# Patient Record
Sex: Female | Born: 1990 | Race: White | Hispanic: No | Marital: Single | Smoking: Never smoker
Health system: Southern US, Community
[De-identification: ages and names within clinical notes are randomized; demographics above are authoritative.]

---
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
## 2009-03-09 ENCOUNTER — Ambulatory Visit (HOSPITAL_COMMUNITY): Payer: Self-pay | Admitting: Psychiatry

## 2009-05-25 ENCOUNTER — Ambulatory Visit (HOSPITAL_COMMUNITY): Payer: Self-pay | Admitting: Psychiatry

## 2009-07-02 ENCOUNTER — Ambulatory Visit (HOSPITAL_COMMUNITY): Payer: Self-pay | Admitting: Psychiatry

## 2009-10-01 ENCOUNTER — Ambulatory Visit (HOSPITAL_COMMUNITY): Payer: Self-pay | Admitting: Psychiatry

## 2009-11-09 ENCOUNTER — Other Ambulatory Visit: Admission: RE | Admit: 2009-11-09 | Discharge: 2009-11-09 | Payer: Self-pay | Admitting: Family Medicine

## 2010-07-20 ENCOUNTER — Ambulatory Visit (HOSPITAL_COMMUNITY): Payer: Self-pay | Admitting: Psychiatry

## 2010-10-19 ENCOUNTER — Ambulatory Visit (HOSPITAL_COMMUNITY): Admit: 2010-10-19 | Payer: Self-pay | Admitting: Psychiatry

## 2010-11-18 ENCOUNTER — Encounter (HOSPITAL_COMMUNITY): Payer: Self-pay | Admitting: Physician Assistant

## 2010-11-29 ENCOUNTER — Encounter (HOSPITAL_COMMUNITY): Payer: Self-pay | Admitting: Physician Assistant

## 2010-11-29 DIAGNOSIS — F988 Other specified behavioral and emotional disorders with onset usually occurring in childhood and adolescence: Secondary | ICD-10-CM

## 2010-11-29 DIAGNOSIS — F329 Major depressive disorder, single episode, unspecified: Secondary | ICD-10-CM

## 2010-11-29 DIAGNOSIS — F3289 Other specified depressive episodes: Secondary | ICD-10-CM

## 2010-12-01 ENCOUNTER — Other Ambulatory Visit: Payer: Self-pay | Admitting: Physician Assistant

## 2010-12-01 ENCOUNTER — Other Ambulatory Visit (HOSPITAL_COMMUNITY)
Admission: RE | Admit: 2010-12-01 | Discharge: 2010-12-01 | Disposition: A | Discharge status: Home / Self Care | Payer: 59 | Source: Ambulatory Visit | Attending: Family Medicine | Admitting: Family Medicine

## 2010-12-01 DIAGNOSIS — Z124 Encounter for screening for malignant neoplasm of cervix: Secondary | ICD-10-CM | POA: Insufficient documentation

## 2011-02-28 ENCOUNTER — Encounter (HOSPITAL_COMMUNITY): Payer: Self-pay | Admitting: Physician Assistant

## 2011-03-21 ENCOUNTER — Encounter (HOSPITAL_COMMUNITY): Payer: Private Health Insurance - Indemnity | Admitting: Physician Assistant

## 2011-04-13 ENCOUNTER — Encounter (HOSPITAL_COMMUNITY): Payer: Private Health Insurance - Indemnity | Admitting: Physician Assistant

## 2011-04-13 DIAGNOSIS — F329 Major depressive disorder, single episode, unspecified: Secondary | ICD-10-CM

## 2011-04-13 DIAGNOSIS — F988 Other specified behavioral and emotional disorders with onset usually occurring in childhood and adolescence: Secondary | ICD-10-CM

## 2011-06-01 ENCOUNTER — Ambulatory Visit (HOSPITAL_BASED_OUTPATIENT_CLINIC_OR_DEPARTMENT_OTHER): Payer: Private Health Insurance - Indemnity | Admitting: Psychology

## 2011-06-01 DIAGNOSIS — F39 Unspecified mood [affective] disorder: Secondary | ICD-10-CM

## 2011-06-09 ENCOUNTER — Encounter (INDEPENDENT_AMBULATORY_CARE_PROVIDER_SITE_OTHER): Payer: Private Health Insurance - Indemnity | Admitting: Psychology

## 2011-06-09 DIAGNOSIS — F39 Unspecified mood [affective] disorder: Secondary | ICD-10-CM

## 2011-06-23 ENCOUNTER — Encounter (HOSPITAL_COMMUNITY): Payer: Private Health Insurance - Indemnity | Admitting: Psychology

## 2011-07-12 ENCOUNTER — Encounter (INDEPENDENT_AMBULATORY_CARE_PROVIDER_SITE_OTHER): Payer: Private Health Insurance - Indemnity | Admitting: Physician Assistant

## 2011-07-12 DIAGNOSIS — F411 Generalized anxiety disorder: Secondary | ICD-10-CM

## 2011-07-12 DIAGNOSIS — F329 Major depressive disorder, single episode, unspecified: Secondary | ICD-10-CM

## 2011-07-21 ENCOUNTER — Encounter (HOSPITAL_COMMUNITY): Payer: Private Health Insurance - Indemnity | Admitting: Psychology

## 2011-07-21 ENCOUNTER — Encounter (HOSPITAL_COMMUNITY): Payer: Self-pay

## 2011-08-28 ENCOUNTER — Other Ambulatory Visit (HOSPITAL_COMMUNITY): Payer: Self-pay | Admitting: Physician Assistant

## 2011-08-28 DIAGNOSIS — F988 Other specified behavioral and emotional disorders with onset usually occurring in childhood and adolescence: Secondary | ICD-10-CM

## 2011-08-28 MED ORDER — LISDEXAMFETAMINE DIMESYLATE 30 MG PO CAPS: 30.0000 mg | ORAL_CAPSULE | ORAL | Status: DC

## 2011-09-08 ENCOUNTER — Other Ambulatory Visit (HOSPITAL_COMMUNITY): Payer: Self-pay | Admitting: Physician Assistant

## 2011-09-08 DIAGNOSIS — F411 Generalized anxiety disorder: Secondary | ICD-10-CM

## 2011-09-08 DIAGNOSIS — F329 Major depressive disorder, single episode, unspecified: Secondary | ICD-10-CM

## 2011-09-11 ENCOUNTER — Other Ambulatory Visit: Payer: Self-pay | Admitting: Physician Assistant

## 2011-09-11 MED ORDER — SERTRALINE HCL 50 MG PO TABS: 50.0000 mg | ORAL_TABLET | Freq: Every day | ORAL | Status: DC

## 2011-09-13 ENCOUNTER — Ambulatory Visit (INDEPENDENT_AMBULATORY_CARE_PROVIDER_SITE_OTHER): Payer: Private Health Insurance - Indemnity | Admitting: Physician Assistant

## 2011-09-13 DIAGNOSIS — F988 Other specified behavioral and emotional disorders with onset usually occurring in childhood and adolescence: Secondary | ICD-10-CM

## 2011-09-13 MED ORDER — LISDEXAMFETAMINE DIMESYLATE 30 MG PO CAPS: 30.0000 mg | ORAL_CAPSULE | ORAL | Status: DC

## 2011-09-13 NOTE — Progress Notes (Signed)
   [facility] Follow-up Outpatient Visit  Donna Frost Sep 12, 1991  Date: 09/13/11    Subjective: She reports that she is doing well overall. She states that school is very stressful right now and she is looking forward to finishing on December 7. She has plans to attend a study abroad program in [place], [place] beginning early February 2013. She has much preparation to do before that time. We will need to work out how she will receive her medications while out of the country. We discussed having her mother mail them to her. She reports that her depression and anxiety are well controlled. She endorses good sleep and appetite.  There were no vitals filed for this visit.  Mental Status Examination  Appearance: Well groomed and casually dressed Alert: Yes Attention: good  Cooperative: Yes Eye Contact: Good Speech: Clear and even Psychomotor Activity: Normal Memory/Concentration: Intact Oriented: person, place, time/date and situation Mood: Euthymic Affect: Congruent Thought Processes and Associations: Goal Directed Fund of Knowledge: Good Thought Content:  Insight: Good Judgement: Good  Diagnosis: ADHD inattentive type, generalized anxiety disorder, depressive disorder NOS.  Treatment Plan: We will continue her medications as currently prescribed. She has an appointment to return in about one month, and at that time we will discuss prescribing her medications while abroad.  Helem Reesor, PA

## 2011-10-03 ENCOUNTER — Ambulatory Visit (HOSPITAL_COMMUNITY): Payer: Private Health Insurance - Indemnity | Admitting: Physician Assistant

## 2011-11-01 ENCOUNTER — Other Ambulatory Visit (HOSPITAL_COMMUNITY): Payer: Self-pay | Admitting: Physician Assistant

## 2011-11-06 ENCOUNTER — Other Ambulatory Visit (HOSPITAL_COMMUNITY): Payer: Self-pay

## 2011-11-07 ENCOUNTER — Encounter (HOSPITAL_COMMUNITY): Payer: Self-pay | Admitting: *Deleted

## 2011-11-07 ENCOUNTER — Other Ambulatory Visit (HOSPITAL_COMMUNITY): Payer: Self-pay | Admitting: *Deleted

## 2011-11-07 DIAGNOSIS — F329 Major depressive disorder, single episode, unspecified: Secondary | ICD-10-CM

## 2011-11-07 DIAGNOSIS — F411 Generalized anxiety disorder: Secondary | ICD-10-CM

## 2011-11-07 DIAGNOSIS — F988 Other specified behavioral and emotional disorders with onset usually occurring in childhood and adolescence: Secondary | ICD-10-CM

## 2011-11-07 MED ORDER — LISDEXAMFETAMINE DIMESYLATE 30 MG PO CAPS: 30.0000 mg | ORAL_CAPSULE | ORAL | Status: DC

## 2011-11-07 MED ORDER — SERTRALINE HCL 50 MG PO TABS: 50.0000 mg | ORAL_TABLET | Freq: Every day | ORAL | Status: DC

## 2011-11-08 ENCOUNTER — Other Ambulatory Visit (HOSPITAL_COMMUNITY): Payer: Self-pay

## 2011-11-09 ENCOUNTER — Other Ambulatory Visit (HOSPITAL_COMMUNITY): Payer: Self-pay | Admitting: *Deleted

## 2011-11-09 ENCOUNTER — Encounter (HOSPITAL_COMMUNITY): Payer: Self-pay | Admitting: *Deleted

## 2011-11-09 DIAGNOSIS — F988 Other specified behavioral and emotional disorders with onset usually occurring in childhood and adolescence: Secondary | ICD-10-CM

## 2011-11-09 DIAGNOSIS — F902 Attention-deficit hyperactivity disorder, combined type: Secondary | ICD-10-CM

## 2011-11-09 MED ORDER — SERTRALINE HCL 50 MG PO TABS: 50.0000 mg | ORAL_TABLET | Freq: Every day | ORAL | Status: DC

## 2011-11-09 MED ORDER — AMPHETAMINE-DEXTROAMPHETAMINE 10 MG PO TABS: ORAL_TABLET | ORAL | Status: DC

## 2012-06-06 ENCOUNTER — Other Ambulatory Visit (HOSPITAL_COMMUNITY): Payer: Self-pay | Admitting: *Deleted

## 2012-06-06 DIAGNOSIS — F988 Other specified behavioral and emotional disorders with onset usually occurring in childhood and adolescence: Secondary | ICD-10-CM

## 2012-06-06 NOTE — Telephone Encounter (Addendum)
Has been out of country studying. Now is back and needs refills of stimulants Did not request Zoloft

## 2012-06-20 ENCOUNTER — Other Ambulatory Visit (HOSPITAL_COMMUNITY): Payer: Self-pay | Admitting: *Deleted

## 2012-06-20 DIAGNOSIS — F988 Other specified behavioral and emotional disorders with onset usually occurring in childhood and adolescence: Secondary | ICD-10-CM

## 2012-06-21 MED ORDER — AMPHETAMINE-DEXTROAMPHETAMINE 10 MG PO TABS: ORAL_TABLET | ORAL | Status: DC

## 2012-06-21 MED ORDER — LISDEXAMFETAMINE DIMESYLATE 30 MG PO CAPS: 30.0000 mg | ORAL_CAPSULE | ORAL | Status: AC

## 2012-08-07 ENCOUNTER — Ambulatory Visit (INDEPENDENT_AMBULATORY_CARE_PROVIDER_SITE_OTHER): Payer: Private Health Insurance - Indemnity | Admitting: Physician Assistant

## 2012-08-07 DIAGNOSIS — F988 Other specified behavioral and emotional disorders with onset usually occurring in childhood and adolescence: Secondary | ICD-10-CM | POA: Insufficient documentation

## 2012-08-07 MED ORDER — LISDEXAMFETAMINE DIMESYLATE 30 MG PO CAPS: 30.0000 mg | ORAL_CAPSULE | ORAL | Status: DC

## 2012-08-07 MED ORDER — AMPHETAMINE-DEXTROAMPHETAMINE 10 MG PO TABS: ORAL_TABLET | ORAL | Status: DC

## 2012-08-07 NOTE — Progress Notes (Signed)
   [facility] Follow-up Outpatient Visit  Marijane Trower May 30, 1991  Date: 08/07/2012   Subjective: Ellaina presents today to followup on her treatment for ADHD. She was in [place] from January through July of this year. She plans to graduate in December of this year, and reports that she is very busy trying to wrap things up at school. She continues to take the Vyvanse 30 mg some days, and Adderall 10 or 20 mg on other days. She feels that they are helpful and wants to continue them at least through school. 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 While in [place] she stopped taking the Prozac, and feels that she no longer needs it. She does report that she is having some anxiety regarding school, and has found it difficult to make class some days. She denies any suicidal or homicidal ideation. She denies any auditory or visual hallucinations.  There were no vitals filed for this visit.  Mental Status Examination  Appearance: Well groomed and casually dressed Alert: Yes Attention: good  Cooperative: Yes Eye Contact: Good Speech: Clear and coherent Psychomotor Activity: Normal Memory/Concentration: Intact Oriented: person, place, time/date and situation Mood: Euthymic Affect: Appropriate Thought Processes and Associations: Linear Fund of Knowledge: Fair Thought Content: Normal Insight: Fair Judgement: Good  Diagnosis: ADHD, inattentive type  Treatment Plan: We will continue her Vyvanse 30 mg daily or Adderall 10-20 mg daily as is appropriate for that day. We will discontinue her Zoloft for now, but if her anxiety continues we can resume it. She will return for followup in 3 months.  Auron Tadros, PA-C

## 2012-11-07 ENCOUNTER — Ambulatory Visit (INDEPENDENT_AMBULATORY_CARE_PROVIDER_SITE_OTHER): Payer: Private Health Insurance - Indemnity | Admitting: Physician Assistant

## 2012-11-07 DIAGNOSIS — F988 Other specified behavioral and emotional disorders with onset usually occurring in childhood and adolescence: Secondary | ICD-10-CM

## 2012-11-07 MED ORDER — AMPHETAMINE-DEXTROAMPHETAMINE 10 MG PO TABS: ORAL_TABLET | ORAL | Status: DC

## 2012-11-07 MED ORDER — LISDEXAMFETAMINE DIMESYLATE 30 MG PO CAPS: 30.0000 mg | ORAL_CAPSULE | ORAL | Status: DC

## 2012-11-07 NOTE — Progress Notes (Signed)
   [facility] Follow-up Outpatient Visit  Donna Frost [birth year]  Date: 11/07/2012   Subjective: Donna Frost presents today to followup on her treatment for ADHD. She happily reports that she graduated and now has a degree in communications. She is currently looking for employment. 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 She recently went to [facility], and got caught in the [place] up there. She did abstain for 2 weeks because she was unable to get a flight home. She denies that she is experiencing any anxiety. She feels that the medications are helpful and sees no reason to change the dosing.  There were no vitals filed for this visit.  Mental Status Examination  Appearance: Casual Alert: Yes Attention: good  Cooperative: Yes Eye Contact: Good Speech: Clear and coherent Psychomotor Activity: Normal Memory/Concentration: Intact Oriented: person, place, time/date and situation Mood: Euthymic Affect: Appropriate Thought Processes and Associations: Linear Fund of Knowledge: Good Thought Content: Normal Insight: Good Judgement: Good  Diagnosis: ADHD, inattentive type  Treatment Plan: We will continue her Vyvanse 30 mg daily for her Adderall 10 mg 1-2 times daily as is appropriate for that particular day. She will return for followup in 3 months.  Jamesyn Moorefield, PA-C

## 2012-11-22 ENCOUNTER — Telehealth (HOSPITAL_COMMUNITY): Payer: Self-pay

## 2012-12-03 ENCOUNTER — Encounter (HOSPITAL_COMMUNITY): Payer: Self-pay | Admitting: *Deleted

## 2012-12-03 NOTE — Progress Notes (Signed)
Received authorization from [facility] for Vyvanse 30 mg daily. Effective 12/03/12 thru 12/03/13 Pharmacy and patient notified

## 2013-02-05 ENCOUNTER — Ambulatory Visit (INDEPENDENT_AMBULATORY_CARE_PROVIDER_SITE_OTHER): Payer: Private Health Insurance - Indemnity | Admitting: Physician Assistant

## 2013-02-05 DIAGNOSIS — F988 Other specified behavioral and emotional disorders with onset usually occurring in childhood and adolescence: Secondary | ICD-10-CM

## 2013-02-05 MED ORDER — AMPHETAMINE-DEXTROAMPHETAMINE 10 MG PO TABS: ORAL_TABLET | ORAL | Status: DC

## 2013-02-05 MED ORDER — LISDEXAMFETAMINE DIMESYLATE 30 MG PO CAPS: 30.0000 mg | ORAL_CAPSULE | ORAL | Status: DC

## 2013-02-05 NOTE — Progress Notes (Signed)
   [facility] Follow-up Outpatient Visit  Donna Frost [birth year]  Date: 02/05/2013   Subjective: [name] presents today to followup on her treatment for ADHD. 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 She states that sometimes she feels that she is wasting her time by doing that, but it is necessary at this point. 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 She continues to use her Vyvanse an Adderall interchangeably depending on what is appropriate for that particular day.  There were no vitals filed for this visit.  Mental Status Examination  Appearance: Well groomed and nicely dressed Alert: Yes Attention: good  Cooperative: Yes Eye Contact: Good Speech: Clear and coherent Psychomotor Activity: Normal Memory/Concentration: Intact Oriented: person, place, time/date and situation Mood: Euthymic Affect: Appropriate Thought Processes and Associations: Linear Fund of Knowledge: Good Thought Content: Normal Insight: Fair Judgement: Good  Diagnosis: ADHD, inattentive type  Treatment Plan: We will continue her Vyvanse 30 mg daily or Adderall 10 mg twice daily as is appropriate. She will return for followup in 3 months  Imanni Burdine, PA-C

## 2013-03-19 ENCOUNTER — Emergency Department (HOSPITAL_COMMUNITY)
Admission: EM | Admit: 2013-03-19 | Discharge: 2013-03-19 | Disposition: A | Discharge status: Home / Self Care | Payer: Managed Care, Other (non HMO) | Attending: Emergency Medicine | Admitting: Emergency Medicine

## 2013-03-19 ENCOUNTER — Emergency Department (HOSPITAL_COMMUNITY): Payer: Managed Care, Other (non HMO)

## 2013-03-19 DIAGNOSIS — W1809XA Striking against other object with subsequent fall, initial encounter: Secondary | ICD-10-CM | POA: Insufficient documentation

## 2013-03-19 DIAGNOSIS — Y9389 Activity, other specified: Secondary | ICD-10-CM | POA: Insufficient documentation

## 2013-03-19 DIAGNOSIS — S1093XA Contusion of unspecified part of neck, initial encounter: Secondary | ICD-10-CM | POA: Insufficient documentation

## 2013-03-19 DIAGNOSIS — Z3202 Encounter for pregnancy test, result negative: Secondary | ICD-10-CM | POA: Insufficient documentation

## 2013-03-19 DIAGNOSIS — F1010 Alcohol abuse, uncomplicated: Secondary | ICD-10-CM | POA: Insufficient documentation

## 2013-03-19 DIAGNOSIS — R1110 Vomiting, unspecified: Secondary | ICD-10-CM | POA: Insufficient documentation

## 2013-03-19 DIAGNOSIS — Y9289 Other specified places as the place of occurrence of the external cause: Secondary | ICD-10-CM | POA: Insufficient documentation

## 2013-03-19 DIAGNOSIS — S0003XA Contusion of scalp, initial encounter: Secondary | ICD-10-CM | POA: Insufficient documentation

## 2013-03-19 DIAGNOSIS — S0990XA Unspecified injury of head, initial encounter: Secondary | ICD-10-CM | POA: Insufficient documentation

## 2013-03-19 DIAGNOSIS — IMO0002 Reserved for concepts with insufficient information to code with codable children: Secondary | ICD-10-CM

## 2013-03-19 LAB — POCT I-STAT, CHEM 8
BUN: 8 mg/dL (ref 6–23)
Calcium, Ion: 1.15 mmol/L (ref 1.12–1.23)
Chloride: 106 mEq/L (ref 96–112)
Creatinine, Ser: 1.1 mg/dL (ref 0.50–1.10)
Glucose, Bld: 124 mg/dL — ABNORMAL HIGH (ref 70–99)
HCT: 40 % (ref 36.0–46.0)
Hemoglobin: 13.6 g/dL (ref 12.0–15.0)
Potassium: 3.1 mEq/L — ABNORMAL LOW (ref 3.5–5.1)
Sodium: 140 mEq/L (ref 135–145)
TCO2: 24 mmol/L (ref 0–100)

## 2013-03-19 LAB — ETHANOL: Alcohol, Ethyl (B): 295 mg/dL — ABNORMAL HIGH (ref 0–11)

## 2013-03-19 LAB — POCT PREGNANCY, URINE: Preg Test, Ur: NEGATIVE

## 2013-03-19 MED ORDER — SODIUM CHLORIDE 0.9 % IV BOLUS (SEPSIS)
1000.0000 mL | Freq: Once | INTRAVENOUS | Status: AC
  Administered 2013-03-19: 1000 mL via INTRAVENOUS

## 2013-03-19 NOTE — ED Notes (Addendum)
Pt brought to ED with slow response. Pt with EtOH on board. Pt reports drinking wine. Not able to report amount consumed. VS stable. Pt able to verbalize name. Pt has dried blood in hair. Small lacer to R side of head. Minimal bleeding.

## 2013-03-19 NOTE — ED Notes (Signed)
Pt sts that she was not able to urinate at this time.  

## 2013-03-24 NOTE — ED Provider Notes (Signed)
History    22 year old female presenting after falling and striking her head. Patient is intoxicated. She is unclear as to the exact circumstances of her fall. She denies any other ingestions aside from alcohol. Patient is currently denying any pain aside from headache, although her history isn't reliable given her level of intoxication. Per review of records, patient appears to be otherwise healthy aside from history of ADD. She is on stimulants for this. No other medications identified, specifically blood thinners.    CSN: [id number]  Arrival date & time 03/19/13  0351   First MD Initiated Contact with Patient 03/19/13 0406      Chief Complaint  Patient presents with  . Alcohol Intoxication  . Head Injury    (Consider location/radiation/quality/duration/timing/severity/associated sxs/prior treatment) HPI  No past medical history on file.  No past surgical history on file.  No family history on file.  History  Substance Use Topics  . Smoking status: Not on file  . Smokeless tobacco: Not on file  . Alcohol Use: Not on file    OB History   No data available      Review of Systems  Level V caveat applies because patient is intoxicated.  Allergies  Review of patient's allergies indicates no known allergies.  Home Medications   Current Outpatient Rx  Name  Route  Sig  Dispense  Refill  . amphetamine-dextroamphetamine (ADDERALL) 10 MG tablet   Oral   Take 10-20 mg by mouth daily as needed (concentration).         Marland Kitchen lisdexamfetamine (VYVANSE) 30 MG capsule   Oral   Take 1 capsule (30 mg total) by mouth every morning.   30 capsule   0     Fill after 03/03/13     BP 103/68  Pulse 100  Temp(Src) 98.9 F (37.2 C) (Oral)  Resp 18  SpO2 100%  LMP 03/05/2013  Physical Exam  Nursing note and vitals reviewed. Constitutional: She appears well-developed and well-nourished.  Vomit on gown  HENT:  Head: Normocephalic.  Hematoma to the high right parietal  region with an overlying abrasion. No active bleeding. Has gag. No intraoral injury/vomit/FB noted.   Eyes: Pupils are equal, round, and reactive to light. Right eye exhibits no discharge. Left eye exhibits no discharge.  conjuntiva injected b/l  Neck: Neck supple.  Cardiovascular: Normal rate, regular rhythm and normal heart sounds.  Exam reveals no gallop and no friction rub.   No murmur heard. Pulmonary/Chest: Effort normal and breath sounds normal. No respiratory distress.  Abdominal: Soft. She exhibits no distension. There is no tenderness.  Musculoskeletal: She exhibits no edema and no tenderness.  No midline spinal tenderness. No apparent bony tenderness of the extremities or apparent pain with range of motion large joints.  Neurological: She is alert. She exhibits normal muscle tone.  Patient's speech is slurred and slow but understandable. Very drowsy. Will awaken to light tactile stimuli but will quickly closer eyes she is not continuously stimulated. She is moving all extremities on command. No obvious facial droop. More complex commands such as finger to nose testing patient is unable to participate with.  Skin: Skin is warm and dry.    ED Course  Procedures (including critical care time)  Labs Reviewed  ETHANOL - Abnormal; Notable for the following:    Alcohol, Ethyl (B) 295 (*)    All other components within normal limits  POCT I-STAT, CHEM 8 - Abnormal; Notable for the following:    Potassium 3.1 (*)  Glucose, Bld 124 (*)    All other components within normal limits  POCT PREGNANCY, URINE   No results found.  Ct Head Wo Contrast  03/19/2013   *RADIOLOGY REPORT*  Clinical Data: History of trauma from a fall.  Hematoma in the high right parietal region.  CT HEAD WITHOUT CONTRAST  Technique:  Contiguous axial images were obtained from the base of the skull through the vertex without contrast.  Comparison: No priors.  Findings: A small amount of soft tissue swelling in the  scalp of the high right parietal region, with several tiny locules of gas in this region, indicative of a hematoma/laceration.  No underlying displaced skull fracture is identified.  No acute intracranial abnormality.  Specifically, no definite evidence of acute post- traumatic intracranial hemorrhage, no signs of acute/subacute cerebral ischemia, no mass, mass effect, hydrocephalus or abnormal intra or extra-axial fluid collections.  Visualized paranasal sinuses and mastoids are well pneumatized.  IMPRESSION: 1.  Small laceration/hematoma in the high right parietal scalp without evidence of underlying skull fracture or acute intracranial abnormality.   Original Report Authenticated By: Trudie Reed, M.D.   EKG:  Rhythm: Sinus tachycardia and Vent. rate 102 BPM First degree AV block. PR interval 204 ms QRS duration 92 ms QT/QTc 360/469 ms Left atrial enlargement  ST segments: nonspecific ST changes Comparison: None   1. Intoxication   2. Head injury, initial encounter       MDM  22 year old female presenting with alcohol intoxication evidence of head injury. Presumably after a fall, although history is not completely clear. CT of the head does not show any acute intracranial abnormality or evidence of fracture. Her EKG is not completely normal his but does not show any abnormality which would necessarily explain a fall. Patient was observed for a period of several hours. She still appears to be mildly intoxicated although significantly improved since she first presented and is HD stable.  I have a very low suspicion for an emergent process requiring further observation, admission or workup. She will be discharged with a sober adult.        Raeford Razor, MD 03/24/13 1435

## 2013-05-07 ENCOUNTER — Ambulatory Visit (INDEPENDENT_AMBULATORY_CARE_PROVIDER_SITE_OTHER): Payer: Private Health Insurance - Indemnity | Admitting: Physician Assistant

## 2013-05-07 VITALS — BP 107/69 | HR 82 | Ht 66.0 in | Wt 146.0 lb

## 2013-05-07 DIAGNOSIS — F988 Other specified behavioral and emotional disorders with onset usually occurring in childhood and adolescence: Secondary | ICD-10-CM

## 2013-05-07 MED ORDER — AMPHETAMINE-DEXTROAMPHETAMINE 10 MG PO TABS: 10.0000 mg | ORAL_TABLET | Freq: Every day | ORAL | Status: DC | PRN

## 2013-05-07 MED ORDER — AMPHETAMINE-DEXTROAMPHET ER 15 MG PO CP24: 15.0000 mg | ORAL_CAPSULE | ORAL | Status: DC

## 2013-05-07 NOTE — Progress Notes (Signed)
[facility] [id number] Progress Note  Donna Frost [id number] 22 y.o.  05/07/2013 3:46 PM  Chief Complaint: followup visit for ADHD  History of Present Illness: Donna Frost presents today to followup on her treatment for ADHD. She reports that the last time she went to purchase the Vyvanse it was extremely expensive. She asked if there were something less expensive that she can try. On review of her chart, it was noted that she was in the emergency department in early June. No reported that she had fallen while intoxicated. We discussed her drinking behavior, as well as any family history of addiction. She reported that her father and her maternal grandfather both had problems with alcohol. She reports that she drinks on average once weekly, and usually only one drink. She reports that when she does drink, she tends to drink too fast. She reports that the occasion where she had to go to the emergency room was an anomaly.  Suicidal Ideation: No Plan Formed: No Patient has means to carry out plan: No  Homicidal Ideation: No Plan Formed: No Patient has means to carry out plan: No  Review of Systems: Psychiatric: Agitation: No Hallucination: No Depressed Mood: No Insomnia: No Hypersomnia: No Altered Concentration: No Feels Worthless: No Grandiose Ideas: No Belief In Special Powers: No New/Increased Substance Abuse: Patient denies Compulsions: No  Neurologic: Headache: No Seizure: No Paresthesias: No  Past Medical History: Noncontributory  Social History: Currently living in a house with 2 roommates. Planning to move back with her mother and father for financial reasons. Currently working 2 jobs in Personnel officer.  Family History: Family history of alcoholism  Outpatient Encounter Prescriptions as of 05/07/2013  Medication Sig Dispense Refill  . amphetamine-dextroamphetamine (ADDERALL XR) 15 MG 24 hr capsule Take 1 capsule (15 mg total) by mouth every morning.  30 capsule  0  .  amphetamine-dextroamphetamine (ADDERALL) 10 MG tablet Take 1-2 tablets (10-20 mg total) by mouth daily as needed (concentration).  60 tablet  0  . lisdexamfetamine (VYVANSE) 30 MG capsule Take 1 capsule (30 mg total) by mouth every morning.  30 capsule  0  . [DISCONTINUED] amphetamine-dextroamphetamine (ADDERALL) 10 MG tablet Take 10-20 mg by mouth daily as needed (concentration).       No facility-administered encounter medications on file as of 05/07/2013.    Past Psychiatric History/Hospitalization(s): Anxiety: No Bipolar Disorder: No Depression: No Mania: No Psychosis: No Schizophrenia: No Personality Disorder: No Hospitalization for psychiatric illness: No History of Electroconvulsive Shock Therapy: No Prior Suicide Attempts: No  Physical Exam: Constitutional:  BP 107/69  Pulse 82  Ht 5\' 6"  (1.676 m)  Wt 146 lb (66.225 kg)  BMI 23.58 kg/m2  General Appearance: alert, oriented, no acute distress, well nourished and casually dressed  Musculoskeletal: Strength & Muscle Tone: within normal limits Gait & Station: normal Patient leans: N/A  Psychiatric: Speech (describe rate, volume, coherence, spontaneity, and abnormalities if any): Clear and coherent had a regular rate and rhythm and normal volume  Thought Process (describe rate, content, abstract reasoning, and computation): within normal limits  Associations: Intact  Thoughts: normal  Mental Status: Orientation: oriented to person, place, time/date and situation Mood & Affect: euthymic mood with congruent affect Attention Span & Concentration: intact  Medical Decision Making (Choose Three): Established Problem, Stable/Improving (1), Review of Psycho-Social Stressors (1) and Review of New Medication or Change in Dosage (2)  Assessment: Axis I: ADHD, inattentive type  Axis II: deferred  Axis III: noncontributory  Axis IV: moderate  Axis V: 75   Plan: We will try her on Adderall XR 15 mg daily, and  continue the short acting Adderall 10-20 mg daily as needed. She has been given a discount card to see if it'll help on the price of Vyvanse. She is to report back in a few weeks as to her response to the extended release Adderall, and let us know if she wants to continue it or go back to using the Vyvanse. At that time she will require a 90 day prescription. We spoke at some length regarding the genetic predisposition to alcoholism, and she was warned to be very vigilant for problems in stopping drinking when she wants to. She will return for followup visit in 3 months.  Nadezhda Pollitt, PA-C 05/07/2013

## 2013-08-05 ENCOUNTER — Ambulatory Visit (HOSPITAL_COMMUNITY): Payer: Self-pay | Admitting: Physician Assistant

## 2013-08-11 ENCOUNTER — Ambulatory Visit (HOSPITAL_COMMUNITY): Payer: Self-pay | Admitting: Physician Assistant

## 2013-08-28 ENCOUNTER — Other Ambulatory Visit (HOSPITAL_COMMUNITY): Payer: Self-pay | Admitting: *Deleted

## 2013-08-28 DIAGNOSIS — F909 Attention-deficit hyperactivity disorder, unspecified type: Secondary | ICD-10-CM

## 2013-08-29 ENCOUNTER — Other Ambulatory Visit (HOSPITAL_COMMUNITY): Payer: Self-pay | Admitting: Psychiatry

## 2013-08-29 NOTE — Telephone Encounter (Signed)
Need to be seen

## 2013-09-02 MED ORDER — AMPHETAMINE-DEXTROAMPHETAMINE 10 MG PO TABS: 10.0000 mg | ORAL_TABLET | Freq: Every day | ORAL | Status: DC | PRN

## 2013-09-02 NOTE — Addendum Note (Signed)
Addended by: Tonny Bollman on: 09/02/2013 03:56 PM   Modules accepted: Orders

## 2013-09-02 NOTE — Telephone Encounter (Signed)
Refill authorized by Dr.Kumar as previous provider no longer with practice.

## 2013-09-15 ENCOUNTER — Encounter (HOSPITAL_COMMUNITY): Payer: Self-pay | Admitting: Psychiatry

## 2013-09-15 ENCOUNTER — Ambulatory Visit (INDEPENDENT_AMBULATORY_CARE_PROVIDER_SITE_OTHER): Payer: Private Health Insurance - Indemnity | Admitting: Psychiatry

## 2013-09-15 VITALS — BP 109/66 | HR 76 | Ht 66.0 in | Wt 147.0 lb

## 2013-09-15 DIAGNOSIS — F329 Major depressive disorder, single episode, unspecified: Secondary | ICD-10-CM

## 2013-09-15 DIAGNOSIS — F988 Other specified behavioral and emotional disorders with onset usually occurring in childhood and adolescence: Secondary | ICD-10-CM

## 2013-09-15 NOTE — Progress Notes (Signed)
[facility] [id number] Progress Note  Donna Frost [id number] 22 y.o.  09/15/2013 3:46 PM  Chief Complaint:  I have depression and  ADHD  History of Present Illness:  Donna Frost is 22 year old Caucasian single unemployed female who came for her appointment.  She is diagnosed with depression and ADHD.  She is taking Adderall which is given by physician assistant the patient has been seen in this office since 2007.  She was taking Prozac however she stopped one year ago because she felt it was not working.  Recently stimulant was changed because she could not afford Vyvanse .  She is taking Adderall however she continued to drink mostly on the weekends.  She admitted some depressive symptoms .  She has some lack of sleep but denies any agitation anger or any mood swings.  She denies any intravenous drug use or any other illegal substances.  She had paranoia or any hallucination.  She is living with her parents.  She is graduated since December 2013 and looking for a job.  She admitted not taking the Adderall on those days when she drank .  In June 2014 she had visited the emergency room because of intoxication and her blood alcohol level was 195.  We discussed her drinking behavior, as well as any family history of addiction. She reported that her father and her maternal grandfather both had problems with alcohol.  We talked about trying Wellbutrin this she had never tried before.  Patient is willing to give [place] a call back if she decided on Wellbutrin after doing some research.  We will not provide any stimulant on this visit.  Suicidal Ideation: No Plan Formed: No Patient has means to carry out plan: No  Homicidal Ideation: No Plan Formed: No Patient has means to carry out plan: No  Review of Systems: Psychiatric: Agitation: No Hallucination: No Depressed Mood: Yes Insomnia: No Hypersomnia: Yes Altered Concentration: No Feels Worthless: No Grandiose Ideas: No Belief In Special Powers:  No New/Increased Substance Abuse: Patient denies Compulsions: No  Neurologic: Headache: No Seizure: No Paresthesias: No  Past Medical History:  Noncontributory.  Her primary care physician is [facility].  Social History:  Patient recently moved to live with her parents.  She is graduated last year and looking for a job.   She stopped working 2 months ago because she could not handle it very well.  Family History:  Family history of alcoholism  Outpatient Encounter Prescriptions as of 09/15/2013  Medication Sig  . amphetamine-dextroamphetamine (ADDERALL) 10 MG tablet Take 1-2 tablets (10-20 mg total) by mouth daily as needed (concentration).    Past Psychiatric History/Hospitalization(s): Patient is seen in this office since 2007.  She was diagnosed with depression and ADHD.  She is to see therapist in his office however she stopped seeing therapist because she felt it was not helpful.  In the past she had tried Prozac, Zoloft , vyvanse and Adderall. Anxiety: No Bipolar Disorder: No Depression: No Mania: No Psychosis: No Schizophrenia: No Personality Disorder: No Hospitalization for psychiatric illness: No History of Electroconvulsive Shock Therapy: No Prior Suicide Attempts: No  Physical Exam: Constitutional:  BP 109/66  Pulse 76  Ht 5\' 6"  (1.676 m)  Wt 147 lb (66.679 kg)  BMI 23.74 kg/m2  General Appearance: alert, oriented, no acute distress, well nourished and casually dressed  Musculoskeletal: Strength & Muscle Tone: within normal limits Gait & Station: normal Patient leans: N/A  Psychiatric: Speech (describe rate, volume, coherence, spontaneity, and  abnormalities if any): Clear and coherent had a regular rate and rhythm and normal volume  Thought Process (describe rate, content, abstract reasoning, and computation): within normal limits  Associations: Intact  Thoughts: normal  Mental Status: Orientation: oriented to person, place,  time/date and situation Mood & Affect: euthymic mood with congruent affect Attention Span & Concentration: intact  Medical Decision Making (Choose Three): Established Problem, Stable/Improving (1), New problem, with additional work up planned, Review of Psycho-Social Stressors (1), Decision to obtain old records (1), Review and summation of old records (2), Established Problem, Worsening (2), Review of Medication Regimen & Side Effects (2) and Review of New Medication or Change in Dosage (2)  Assessment: Axis I: ADHD, inattentive type, depressive disorder NOS  Axis II: deferred  Axis III: noncontributory  Axis IV: moderate  Axis V: 75   Plan:  I review her chart, history, psychosocial stressors, current and past medication.  Discussed in detail about stimulant use and use of continued drinking .  Recommend to try Wellbutrin XL 150 mg however patient wants to get more information any time and research on Wellbutrin.  I recommend to call us back if she decided to start Wellbutrin.  Patient is not interested in counseling at this time.  No new Prescription is given at this time.  Followup in 4 weeks or when she is ready for new prescription. Time spent 25 minutes.  More than 50% of the time spent in psychoeducation, counseling and coordination of care.  Discuss safety plan that anytime having active suicidal thoughts or homicidal thoughts then patient need to call 911 or go to the local emergency room.  Chevy Sweigert T., MD 09/15/2013

## 2013-10-21 ENCOUNTER — Ambulatory Visit (INDEPENDENT_AMBULATORY_CARE_PROVIDER_SITE_OTHER): Payer: PRIVATE HEALTH INSURANCE | Admitting: Psychiatry

## 2013-10-21 ENCOUNTER — Encounter (HOSPITAL_COMMUNITY): Payer: Self-pay | Admitting: Psychiatry

## 2013-10-21 VITALS — BP 114/75 | HR 83 | Ht 66.0 in | Wt 146.6 lb

## 2013-10-21 DIAGNOSIS — F988 Other specified behavioral and emotional disorders with onset usually occurring in childhood and adolescence: Secondary | ICD-10-CM

## 2013-10-21 DIAGNOSIS — F329 Major depressive disorder, single episode, unspecified: Secondary | ICD-10-CM

## 2013-10-21 DIAGNOSIS — F3289 Other specified depressive episodes: Secondary | ICD-10-CM

## 2013-10-21 DIAGNOSIS — F909 Attention-deficit hyperactivity disorder, unspecified type: Secondary | ICD-10-CM

## 2013-10-21 MED ORDER — AMPHETAMINE-DEXTROAMPHETAMINE 10 MG PO TABS: ORAL_TABLET | ORAL | Status: AC

## 2013-10-21 NOTE — Progress Notes (Signed)
[facility] [id number] Progress Note  Donna Frost [id number] 23 y.o.  10/21/2013 3:46 PM  Chief Complaint:  I cut on my drinking.  I don't want to take Wellbutrin.  I still want to take Adderall only as needed.    History of Present Illness:  Donna Frost came for her followup appointment.  On her last visit I recommended to try Wellbutrin but after some research patient decided not to take Wellbutrin.  She does not want to take any medication on a daily basis.  She has cut down her drinking.  She only drank on New Year's Eve 2-3 glasses.  She denies any depressive thoughts.  She does not feel that she requires medication every day .  She requires Adderall only days when she is applying for a job application.  The patient denies any agitation, anger or any mood swing.  He denies any irritability or anger.  She sleeping good.  She lives with her parents.  She is actively looking for a job.  Patient promised to cut down further drinking and like to eventually stopped.  Suicidal Ideation: No Plan Formed: No Patient has means to carry out plan: No  Homicidal Ideation: No Plan Formed: No Patient has means to carry out plan: No  Review of Systems: Psychiatric: Agitation: No Hallucination: No Depressed Mood: No Insomnia: No Hypersomnia: No Altered Concentration: No Feels Worthless: No Grandiose Ideas: No Belief In Special Powers: No New/Increased Substance Abuse: Patient denies Compulsions: No  Neurologic: Headache: No Seizure: No Paresthesias: No  Past Medical History:  Noncontributory.  Her [facility].  Outpatient Encounter Prescriptions as of 10/21/2013  Medication Sig  . amphetamine-dextroamphetamine (ADDERALL) 10 MG tablet Take 1 tab daily  . [DISCONTINUED] amphetamine-dextroamphetamine (ADDERALL) 10 MG tablet Take 1-2 tablets (10-20 mg total) by mouth daily as needed (concentration).    Past Psychiatric  History/Hospitalization(s): Patient is seen in this office since 2007.  She was diagnosed with depression and ADHD.  She is to see therapist in his office however she stopped seeing therapist because she felt it was not helpful.  In the past she had tried Prozac, Zoloft , vyvanse and Adderall. Anxiety: No Bipolar Disorder: No Depression: No Mania: No Psychosis: No Schizophrenia: No Personality Disorder: No Hospitalization for psychiatric illness: No History of Electroconvulsive Shock Therapy: No Prior Suicide Attempts: No  Physical Exam: Constitutional:  BP 114/75  Pulse 83  Ht 5\' 6"  (1.676 m)  Wt 146 lb 9.6 oz (66.497 kg)  BMI 23.67 kg/m2  General Appearance: alert, oriented, no acute distress, well nourished and casually dressed  Musculoskeletal: Strength & Muscle Tone: within normal limits Gait & Station: normal Patient leans: N/A  Psychiatric: Speech (describe rate, volume, coherence, spontaneity, and abnormalities if any): Clear and coherent had a regular rate and rhythm and normal volume  Thought Process (describe rate, content, abstract reasoning, and computation): within normal limits  Associations: Intact  Thoughts: normal  Mental Status: Orientation: oriented to person, place, time/date, situation, day of week and month of year Mood & Affect: euthymic mood with congruent affect Attention Span & Concentration: intact  Medical Decision Making (Choose Three): Established Problem, Stable/Improving (1), Review of Last Therapy Session (1) and Review of Medication Regimen & Side Effects (2)  Assessment: Axis I: ADHD, inattentive type, depressive disorder NOS  Axis II: deferred  Axis III: noncontributory  Axis IV: moderate  Axis V: 75   Plan:  I discussed again about her use of drinking  but patient has cut down her drinking from the past.  She promised to stop eventually.  She is using Adderall 10 mg per those days when she applied jump medication.  She  usually requires 2-3 times a week.  Patient does not want to take any medication on a daily basis.  Patient does not have any side effects she takes Adderall.  She is not interested in Wellbutrin.  We will provide 30 pills of Wellbutrin however I strongly encouraged to stop drinking .   Recommended but patient does not feel she needed.  Recommend to call us back if she has any question or any concern.  Followup in 2 months.  [facility] T., MD 10/21/2013

## 2013-12-22 ENCOUNTER — Ambulatory Visit (HOSPITAL_COMMUNITY): Payer: Self-pay | Admitting: Psychiatry

## 2014-11-14 IMAGING — CT CT HEAD W/O CM
2 series · 16 of 30 positions shown, 19 images · non-contrast
Comparison: No priors.

CLINICAL DATA: History of trauma from a fall.  Hematoma in the high
right parietal region.

CT HEAD WITHOUT CONTRAST
TECHNIQUE: Contiguous axial images were obtained from the base of
the skull through the vertex without contrast.

[Series 2: head w/o · axial · non-contrast · 0.43mm/px · z∈[+1319,+1439]mm · 9 of 30 slices shown, 12 images]
[im 3/30  brain]
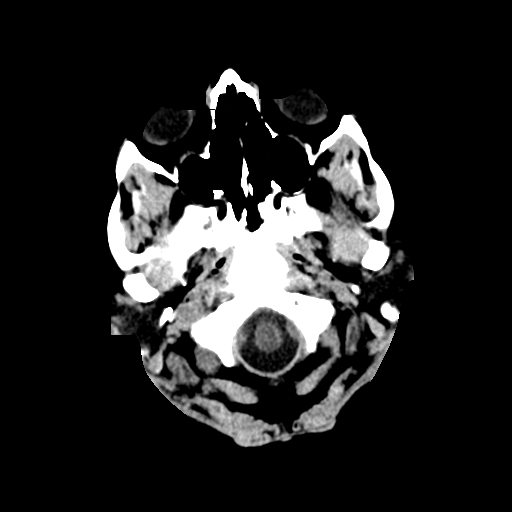
[im 3/30  bone]
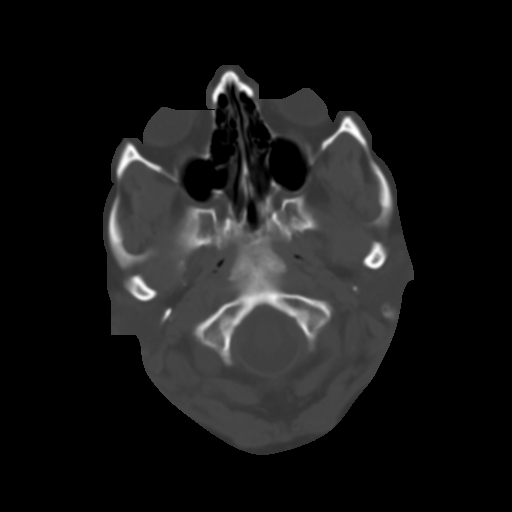
[im 6/30  brain]
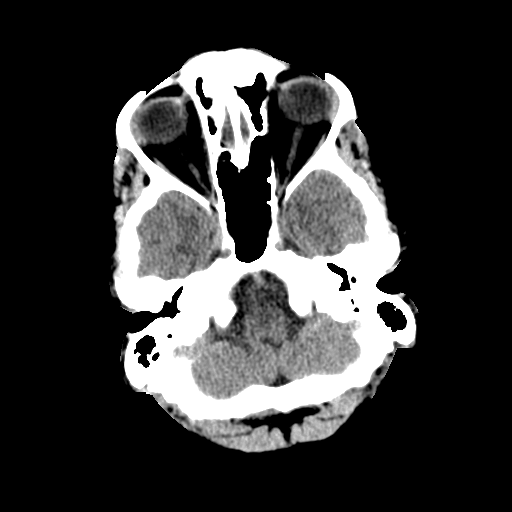
[im 9/30  brain]
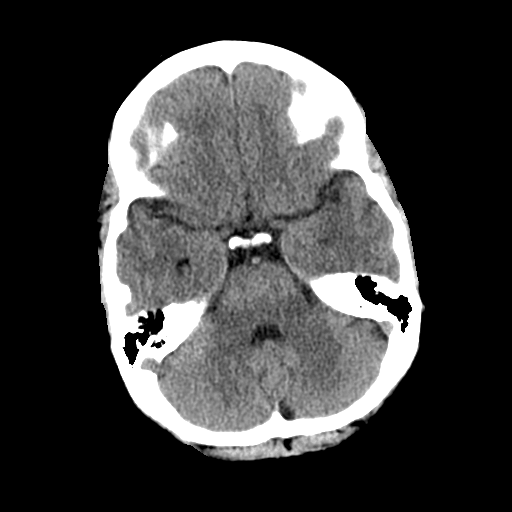
[im 12/30  brain]
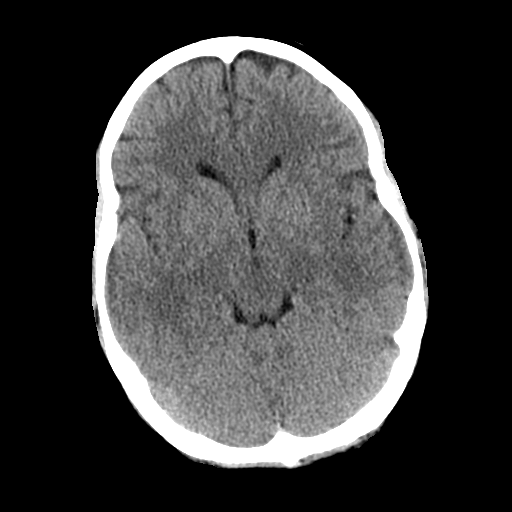
[im 15/30  brain]
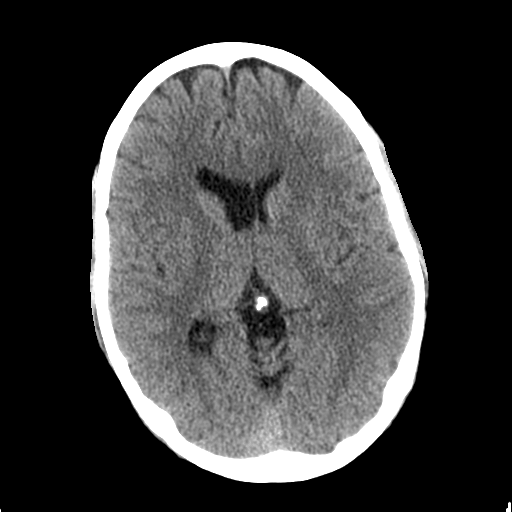
[im 15/30  bone]
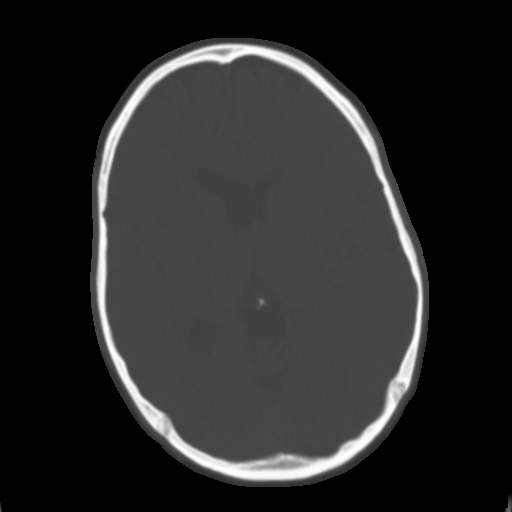
[im 18/30  brain]
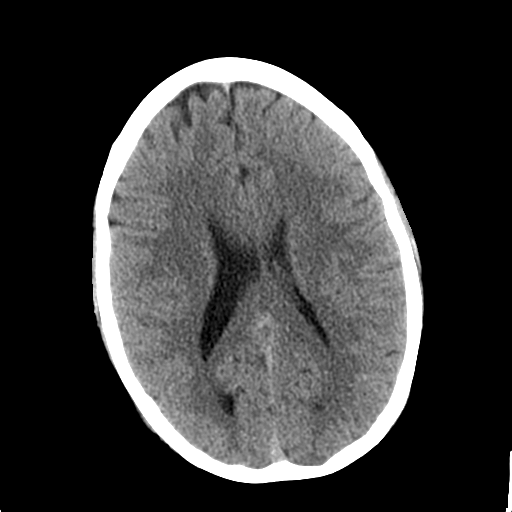
[im 21/30  brain]
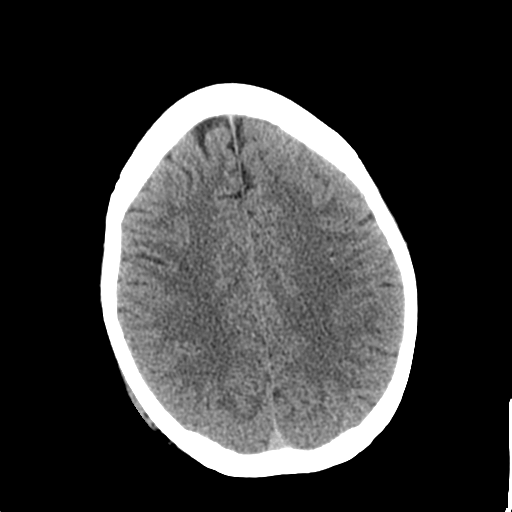
[im 24/30  brain]
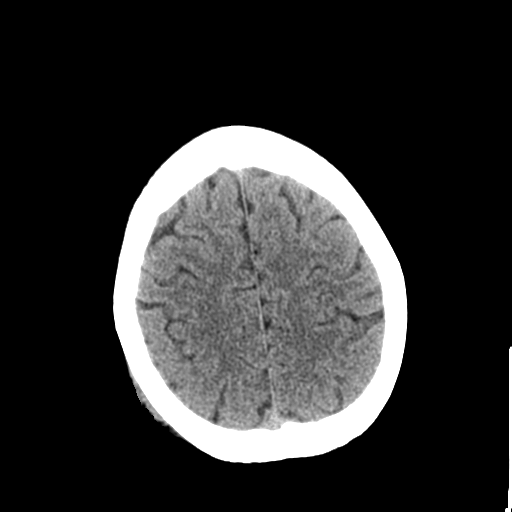
[im 27/30  brain]
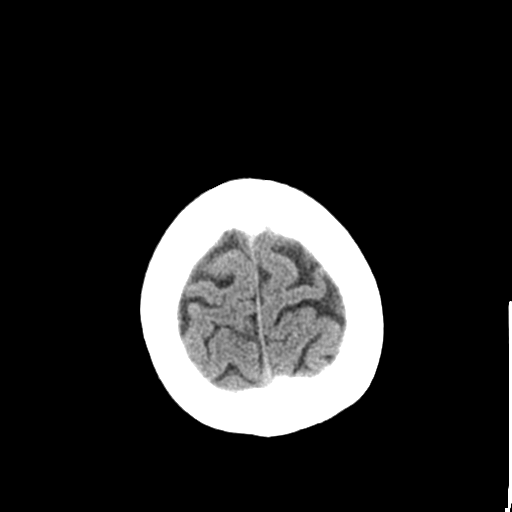
[im 27/30  bone]
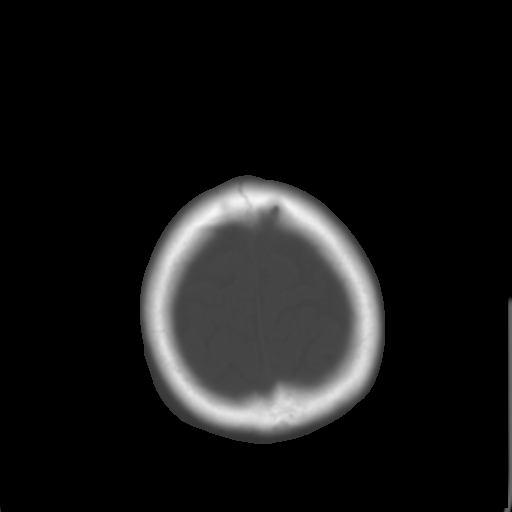

[Series 3: bone windows · axial · 0.43mm/px · z∈[+1324,+1423]mm · 7 of 50 slices shown]
[im 6/50  bone]
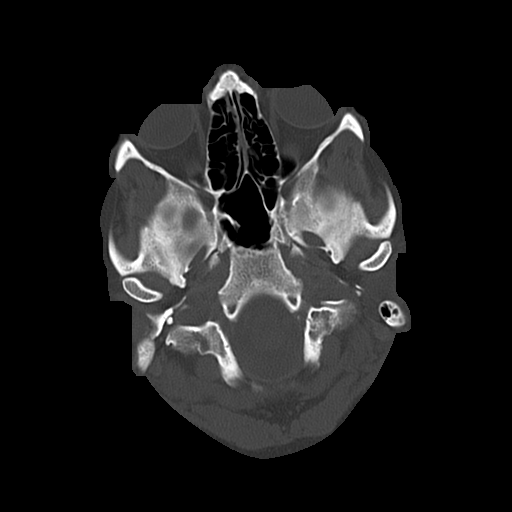
[im 11/50  bone]
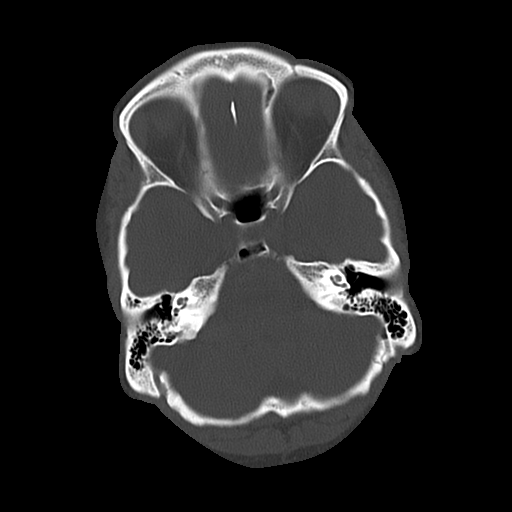
[im 17/50  bone]
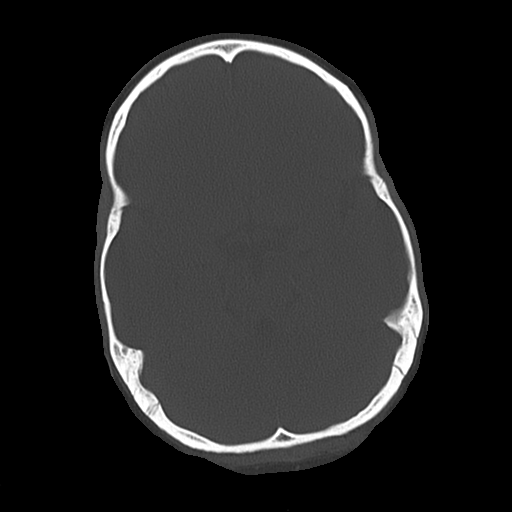
[im 22/50  bone]
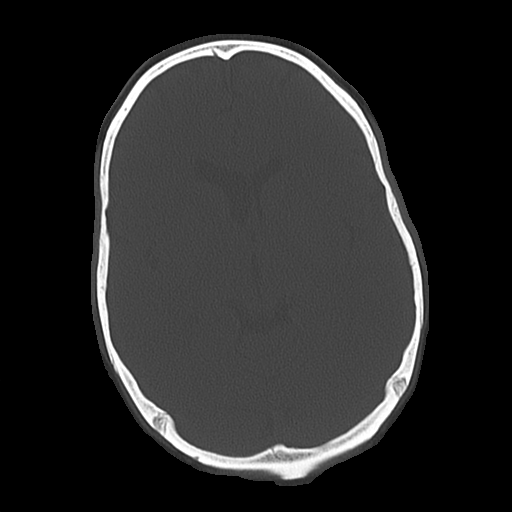
[im 28/50  bone]
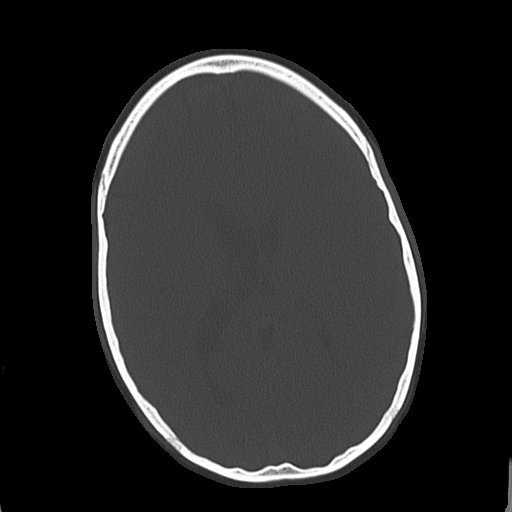
[im 33/50  bone]
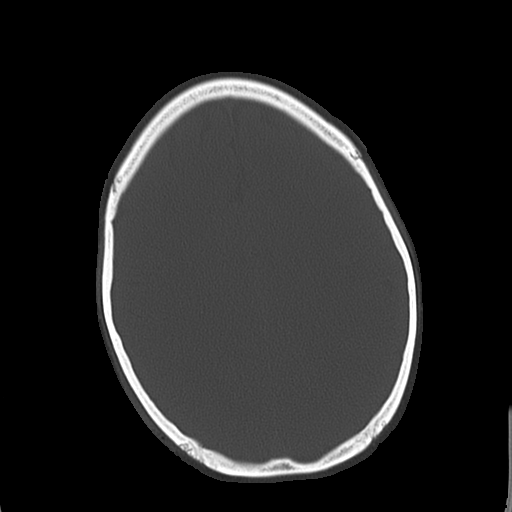
[im 39/50  bone]
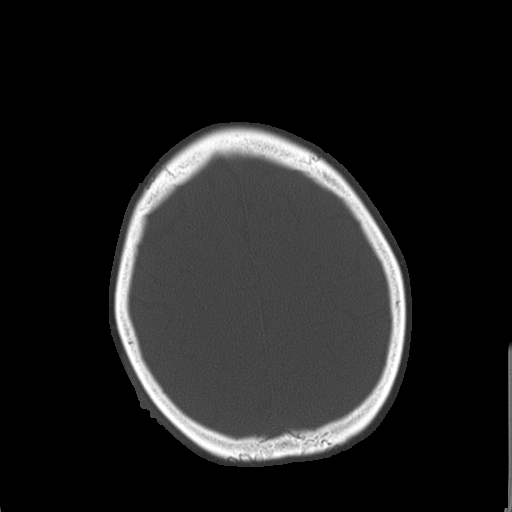

[16 of 30 positions shown; findings below may reference images not displayed]

FINDINGS: A small amount of soft tissue swelling in the scalp of
the high right parietal region, with several tiny locules of gas in
this region, indicative of a hematoma/laceration.  No underlying
displaced skull fracture is identified.  No acute intracranial
abnormality.  Specifically, no definite evidence of acute post-
traumatic intracranial hemorrhage, no signs of acute/subacute
cerebral ischemia, no mass, mass effect, hydrocephalus or abnormal
intra or extra-axial fluid collections.  Visualized paranasal
sinuses and mastoids are well pneumatized.
IMPRESSION: 1.  Small laceration/hematoma in the high right parietal scalp
without evidence of underlying skull fracture or acute intracranial
abnormality.
